# Patient Record
Sex: Male | Born: 1990 | Race: White | Hispanic: No | Marital: Single | Smoking: Current every day smoker
Health system: Southern US, Community
[De-identification: ages and names within clinical notes are randomized; demographics above are authoritative.]

---
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
## 2012-07-14 ENCOUNTER — Emergency Department (HOSPITAL_COMMUNITY): Payer: Self-pay

## 2012-07-14 ENCOUNTER — Emergency Department (HOSPITAL_COMMUNITY)
Admission: EM | Admit: 2012-07-14 | Discharge: 2012-07-14 | Disposition: A | Discharge status: Home / Self Care | Payer: Self-pay | Attending: Emergency Medicine | Admitting: Emergency Medicine

## 2012-07-14 ENCOUNTER — Encounter (HOSPITAL_COMMUNITY): Payer: Self-pay | Admitting: Family Medicine

## 2012-07-14 DIAGNOSIS — F17200 Nicotine dependence, unspecified, uncomplicated: Secondary | ICD-10-CM | POA: Insufficient documentation

## 2012-07-14 DIAGNOSIS — Y92410 Unspecified street and highway as the place of occurrence of the external cause: Secondary | ICD-10-CM | POA: Insufficient documentation

## 2012-07-14 DIAGNOSIS — S39012A Strain of muscle, fascia and tendon of lower back, initial encounter: Secondary | ICD-10-CM

## 2012-07-14 DIAGNOSIS — S335XXA Sprain of ligaments of lumbar spine, initial encounter: Secondary | ICD-10-CM | POA: Insufficient documentation

## 2012-07-14 DIAGNOSIS — Y9389 Activity, other specified: Secondary | ICD-10-CM | POA: Insufficient documentation

## 2012-07-14 DIAGNOSIS — S161XXA Strain of muscle, fascia and tendon at neck level, initial encounter: Secondary | ICD-10-CM

## 2012-07-14 DIAGNOSIS — S139XXA Sprain of joints and ligaments of unspecified parts of neck, initial encounter: Secondary | ICD-10-CM | POA: Insufficient documentation

## 2012-07-14 MED ORDER — METAXALONE 800 MG PO TABS: 800.0000 mg | ORAL_TABLET | Freq: Three times a day (TID) | ORAL | Status: AC | PRN

## 2012-07-14 MED ORDER — NAPROXEN 500 MG PO TABS: 500.0000 mg | ORAL_TABLET | Freq: Two times a day (BID) | ORAL | Status: DC | PRN
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
## 2012-07-14 NOTE — ED Notes (Signed)
Pharmacist at [facility] called to see if we could change the Rx for Selaxin to something cheaper for pt.  Per Muthersbaugh PA, I called in a Rx for Robaxin 500 mg, 1 tab PO Q6 hrs PRN muscle spasm, #20 instead.  Called to [phone] [employer].
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
## 2014-04-28 ENCOUNTER — Emergency Department (HOSPITAL_BASED_OUTPATIENT_CLINIC_OR_DEPARTMENT_OTHER)
Admission: EM | Admit: 2014-04-28 | Discharge: 2014-04-28 | Disposition: A | Discharge status: Home / Self Care | Payer: No Typology Code available for payment source | Attending: Emergency Medicine | Admitting: Emergency Medicine

## 2014-04-28 ENCOUNTER — Encounter (HOSPITAL_BASED_OUTPATIENT_CLINIC_OR_DEPARTMENT_OTHER): Payer: Self-pay | Admitting: Emergency Medicine

## 2014-04-28 ENCOUNTER — Emergency Department (HOSPITAL_BASED_OUTPATIENT_CLINIC_OR_DEPARTMENT_OTHER): Payer: No Typology Code available for payment source

## 2014-04-28 DIAGNOSIS — Z79899 Other long term (current) drug therapy: Secondary | ICD-10-CM | POA: Insufficient documentation

## 2014-04-28 DIAGNOSIS — Y929 Unspecified place or not applicable: Secondary | ICD-10-CM | POA: Insufficient documentation

## 2014-04-28 DIAGNOSIS — S0993XA Unspecified injury of face, initial encounter: Secondary | ICD-10-CM | POA: Insufficient documentation

## 2014-04-28 DIAGNOSIS — IMO0002 Reserved for concepts with insufficient information to code with codable children: Secondary | ICD-10-CM | POA: Insufficient documentation

## 2014-04-28 DIAGNOSIS — Y9389 Activity, other specified: Secondary | ICD-10-CM | POA: Insufficient documentation

## 2014-04-28 DIAGNOSIS — Z791 Long term (current) use of non-steroidal anti-inflammatories (NSAID): Secondary | ICD-10-CM | POA: Insufficient documentation

## 2014-04-28 DIAGNOSIS — F17200 Nicotine dependence, unspecified, uncomplicated: Secondary | ICD-10-CM | POA: Diagnosis not present

## 2014-04-28 DIAGNOSIS — S199XXA Unspecified injury of neck, initial encounter: Secondary | ICD-10-CM | POA: Diagnosis present

## 2014-04-28 MED ORDER — CYCLOBENZAPRINE HCL 10 MG PO TABS: 10.0000 mg | ORAL_TABLET | Freq: Two times a day (BID) | ORAL | Status: AC | PRN

## 2014-04-28 MED ORDER — KETOROLAC TROMETHAMINE 60 MG/2ML IM SOLN
30.0000 mg | Freq: Once | INTRAMUSCULAR | Status: AC
  Administered 2014-04-28: 30 mg via INTRAMUSCULAR
  Filled 2014-04-28: qty 2
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
## 2014-04-30 ENCOUNTER — Emergency Department (HOSPITAL_BASED_OUTPATIENT_CLINIC_OR_DEPARTMENT_OTHER)
Admission: EM | Admit: 2014-04-30 | Discharge: 2014-04-30 | Disposition: A | Discharge status: Home / Self Care | Payer: No Typology Code available for payment source | Attending: Emergency Medicine | Admitting: Emergency Medicine

## 2014-04-30 ENCOUNTER — Encounter (HOSPITAL_BASED_OUTPATIENT_CLINIC_OR_DEPARTMENT_OTHER): Payer: Self-pay | Admitting: Emergency Medicine

## 2014-04-30 ENCOUNTER — Emergency Department (HOSPITAL_BASED_OUTPATIENT_CLINIC_OR_DEPARTMENT_OTHER)
Admission: EM | Admit: 2014-04-30 | Discharge: 2014-04-30 | Disposition: A | Discharge status: Home / Self Care | Payer: No Typology Code available for payment source

## 2014-04-30 DIAGNOSIS — Z79899 Other long term (current) drug therapy: Secondary | ICD-10-CM | POA: Insufficient documentation

## 2014-04-30 DIAGNOSIS — M542 Cervicalgia: Secondary | ICD-10-CM | POA: Insufficient documentation

## 2014-04-30 DIAGNOSIS — G8911 Acute pain due to trauma: Secondary | ICD-10-CM | POA: Diagnosis not present

## 2014-04-30 DIAGNOSIS — Z791 Long term (current) use of non-steroidal anti-inflammatories (NSAID): Secondary | ICD-10-CM | POA: Insufficient documentation

## 2014-04-30 DIAGNOSIS — F17200 Nicotine dependence, unspecified, uncomplicated: Secondary | ICD-10-CM | POA: Insufficient documentation

## 2014-04-30 DIAGNOSIS — S161XXD Strain of muscle, fascia and tendon at neck level, subsequent encounter: Secondary | ICD-10-CM

## 2014-04-30 MED ORDER — IBUPROFEN 800 MG PO TABS: 800.0000 mg | ORAL_TABLET | Freq: Three times a day (TID) | ORAL | Status: AC

## 2014-04-30 MED ORDER — HYDROCODONE-ACETAMINOPHEN 5-325 MG PO TABS: 1.0000 | ORAL_TABLET | ORAL | Status: AC | PRN

## 2014-04-30 NOTE — Discharge Instructions (Signed)
Cryotherapy °Cryotherapy means treatment with cold. Ice or gel packs can be used to reduce both pain and swelling. Ice is the most helpful within the first 24 to 48 hours after an injury or flare-up from overusing a muscle or joint. Sprains, strains, spasms, burning pain, shooting pain, and aches can all be eased with ice. Ice can also be used when recovering from surgery. Ice is effective, has very few side effects, and is safe for most people to use. °PRECAUTIONS  °Ice is not a safe treatment option for people with: °· Raynaud phenomenon. This is a condition affecting small blood vessels in the extremities. Exposure to cold may cause your problems to return. °· Cold hypersensitivity. There are many forms of cold hypersensitivity, including: °¨ Cold urticaria. Red, itchy hives appear on the skin when the tissues begin to warm after being iced. °¨ Cold erythema. This is a red, itchy rash caused by exposure to cold. °¨ Cold hemoglobinuria. Red blood cells break down when the tissues begin to warm after being iced. The hemoglobin that carry oxygen are passed into the urine because they cannot combine with blood proteins fast enough. °· Numbness or altered sensitivity in the area being iced. °If you have any of the following conditions, do not use ice until you have discussed cryotherapy with your caregiver: °· Heart conditions, such as arrhythmia, angina, or chronic heart disease. °· High blood pressure. °· Healing wounds or open skin in the area being iced. °· Current infections. °· Rheumatoid arthritis. °· Poor circulation. °· Diabetes. °Ice slows the blood flow in the region it is applied. This is beneficial when trying to stop inflamed tissues from spreading irritating chemicals to surrounding tissues. However, if you expose your skin to cold temperatures for too long or without the proper protection, you can damage your skin or nerves. Watch for signs of skin damage due to cold. °HOME CARE INSTRUCTIONS °Follow  these tips to use ice and cold packs safely. °· Place a dry or damp towel between the ice and skin. A damp towel will cool the skin more quickly, so you may need to shorten the time that the ice is used. °· For a more rapid response, add gentle compression to the ice. °· Ice for no more than 10 to 20 minutes at a time. The bonier the area you are icing, the less time it will take to get the benefits of ice. °· Check your skin after 5 minutes to make sure there are no signs of a poor response to cold or skin damage. °· Rest 20 minutes or more between uses. °· Once your skin is numb, you can end your treatment. You can test numbness by very lightly touching your skin. The touch should be so light that you do not see the skin dimple from the pressure of your fingertip. When using ice, most people will feel these normal sensations in this order: cold, burning, aching, and numbness. °· Do not use ice on someone who cannot communicate their responses to pain, such as small children or people with dementia. °HOW TO MAKE AN ICE PACK °Ice packs are the most common way to use ice therapy. Other methods include ice massage, ice baths, and cryosprays. Muscle creams that cause a cold, tingly feeling do not offer the same benefits that ice offers and should not be used as a substitute unless recommended by your caregiver. °To make an ice pack, do one of the following: °· Place crushed ice or a   bag of frozen vegetables in a sealable plastic bag. Squeeze out the excess air. Place this bag inside another plastic bag. Slide the bag into a pillowcase or place a damp towel between your skin and the bag.  Mix 3 parts water with 1 part rubbing alcohol. Freeze the mixture in a sealable plastic bag. When you remove the mixture from the freezer, it will be slushy. Squeeze out the excess air. Place this bag inside another plastic bag. Slide the bag into a pillowcase or place a damp towel between your skin and the bag. SEEK MEDICAL CARE  IF:  You develop white spots on your skin. This may give the skin a blotchy (mottled) appearance.  Your skin turns blue or pale.  Your skin becomes waxy or hard.  Your swelling gets worse. MAKE SURE YOU:   Understand these instructions.  Will watch your condition.  Will get help right away if you are not doing well or get worse. Document Released: 04/21/2011 Document Revised: 01/09/2014 Document Reviewed: 04/21/2011 [facility] Patient Information 2015 [place], [place]. This information is not intended to replace advice given to you by your health care provider. Make sure you discuss any questions you have with your health care provider. Cervical Sprain A cervical sprain is when the tissues (ligaments) that hold the neck bones in place stretch or tear. HOME CARE   Put ice on the injured area.  Put ice in a plastic bag.  Place a towel between your skin and the bag.  Leave the ice on for 15-20 minutes, 3-4 times a day.  You may have been given a collar to wear. This collar keeps your neck from moving while you heal.  Do not take the collar off unless told by your doctor.  If you have long hair, keep it outside of the collar.  Ask your doctor before changing the position of your collar. You may need to change its position over time to make it more comfortable.  If you are allowed to take off the collar for cleaning or bathing, follow your doctor's instructions on how to do it safely.  Keep your collar clean by wiping it with mild soap and water. Dry it completely. If the collar has removable pads, remove them every 1-2 days to hand wash them with soap and water. Allow them to air dry. They should be dry before you wear them in the collar.  Do not drive while wearing the collar.  Only take medicine as told by your doctor.  Keep all doctor visits as told.  Keep all physical therapy visits as told.  Adjust your work station so that you have good posture while you  work.  Avoid positions and activities that make your problems worse.  Warm up and stretch before being active. GET HELP IF:  Your pain is not controlled with medicine.  You cannot take less pain medicine over time as planned.  Your activity level does not improve as expected. GET HELP RIGHT AWAY IF:   You are bleeding.  Your stomach is upset.  You have an allergic reaction to your medicine.  You develop new problems that you cannot explain.  You lose feeling (become numb) or you cannot move any part of your body (paralysis).  You have tingling or weakness in any part of your body.  Your symptoms get worse. Symptoms include:  Pain, soreness, stiffness, puffiness (swelling), or a burning feeling in your neck.  Pain when your neck is touched.  Shoulder or  upper back pain.  Limited ability to move your neck.  Headache.  Dizziness.  Your hands or arms feel week, lose feeling, or tingle.  Muscle spasms.  Difficulty swallowing or chewing. MAKE SURE YOU:   Understand these instructions.  Will watch your condition.  Will get help right away if you are not doing well or get worse. Document Released: 02/11/2008 Document Revised: 04/27/2013 Document Reviewed: 03/02/2013 [facility] Patient Information 2015 [place], [place]. This information is not intended to replace advice given to you by your health care provider. Make sure you discuss any questions you have with your health care provider.

## 2014-04-30 NOTE — ED Notes (Signed)
Pt called x 3 no answer 

## 2014-04-30 NOTE — ED Notes (Signed)
Pt called x 1 no answer

## 2014-04-30 NOTE — ED Notes (Signed)
Pt was in [facility] Friday and was seen here.  Pt was discharged with flexeril but the medication makes him feel sick.  Pt is here for re-evaluation due to continued soreness in neck and back.  Pt is alert and oriented, ambulatory in triage

## 2014-04-30 NOTE — ED Notes (Signed)
Pt discharged to home with family. NAD.  
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
## 2014-05-15 NOTE — ED Provider Notes (Signed)
Medical screening examination/treatment/procedure(s) were performed by non-physician practitioner and as supervising physician I was immediately available for consultation/collaboration.   EKG Interpretation None        Rolland Porter, MD 05/15/14 [phone]

## 2015-07-12 ENCOUNTER — Emergency Department (HOSPITAL_COMMUNITY): Payer: Self-pay

## 2015-07-12 ENCOUNTER — Encounter (HOSPITAL_COMMUNITY): Payer: Self-pay | Admitting: *Deleted

## 2015-07-12 ENCOUNTER — Emergency Department (HOSPITAL_COMMUNITY)
Admission: EM | Admit: 2015-07-12 | Discharge: 2015-07-12 | Disposition: A | Discharge status: Home / Self Care | Payer: Self-pay | Attending: Emergency Medicine | Admitting: Emergency Medicine

## 2015-07-12 DIAGNOSIS — S22009A Unspecified fracture of unspecified thoracic vertebra, initial encounter for closed fracture: Secondary | ICD-10-CM

## 2015-07-12 DIAGNOSIS — S199XXA Unspecified injury of neck, initial encounter: Secondary | ICD-10-CM | POA: Insufficient documentation

## 2015-07-12 DIAGNOSIS — S70211A Abrasion, right hip, initial encounter: Secondary | ICD-10-CM | POA: Insufficient documentation

## 2015-07-12 DIAGNOSIS — S3992XA Unspecified injury of lower back, initial encounter: Secondary | ICD-10-CM | POA: Insufficient documentation

## 2015-07-12 DIAGNOSIS — Z79899 Other long term (current) drug therapy: Secondary | ICD-10-CM | POA: Insufficient documentation

## 2015-07-12 DIAGNOSIS — Y9389 Activity, other specified: Secondary | ICD-10-CM | POA: Insufficient documentation

## 2015-07-12 DIAGNOSIS — Z720 Tobacco use: Secondary | ICD-10-CM | POA: Insufficient documentation

## 2015-07-12 DIAGNOSIS — Y92410 Unspecified street and highway as the place of occurrence of the external cause: Secondary | ICD-10-CM | POA: Insufficient documentation

## 2015-07-12 DIAGNOSIS — Y998 Other external cause status: Secondary | ICD-10-CM | POA: Insufficient documentation

## 2015-07-12 DIAGNOSIS — S2220XA Unspecified fracture of sternum, initial encounter for closed fracture: Secondary | ICD-10-CM | POA: Insufficient documentation

## 2015-07-12 DIAGNOSIS — S301XXA Contusion of abdominal wall, initial encounter: Secondary | ICD-10-CM | POA: Insufficient documentation

## 2015-07-12 DIAGNOSIS — S30811A Abrasion of abdominal wall, initial encounter: Secondary | ICD-10-CM | POA: Insufficient documentation

## 2015-07-12 LAB — COMPREHENSIVE METABOLIC PANEL
ALBUMIN: 4.3 g/dL (ref 3.5–5.0)
ALK PHOS: 59 U/L (ref 38–126)
ALT: 33 U/L (ref 17–63)
AST: 29 U/L (ref 15–41)
Anion gap: 9 (ref 5–15)
BILIRUBIN TOTAL: 0.4 mg/dL (ref 0.3–1.2)
BUN: 9 mg/dL (ref 6–20)
CALCIUM: 9 mg/dL (ref 8.9–10.3)
CO2: 29 mmol/L (ref 22–32)
CREATININE: 0.81 mg/dL (ref 0.61–1.24)
Chloride: 104 mmol/L (ref 101–111)
GFR calc Af Amer: >60 mL/min (ref >60)
GFR calc non Af Amer: >60 mL/min (ref >60)
GLUCOSE: 102 mg/dL — AB (ref 65–99)
Potassium: 4.4 mmol/L (ref 3.5–5.1)
Sodium: 142 mmol/L (ref 135–145)
TOTAL PROTEIN: 6.6 g/dL (ref 6.5–8.1)

## 2015-07-12 LAB — CBC WITH DIFFERENTIAL/PLATELET
Basophils Absolute: 0.1 10*3/uL (ref 0.0–0.1)
Basophils Relative: 1 %
EOS PCT: 1 %
Eosinophils Absolute: 0.1 10*3/uL (ref 0.0–0.7)
HEMATOCRIT: 43.9 % (ref 39.0–52.0)
HEMOGLOBIN: 14.5 g/dL (ref 13.0–17.0)
LYMPHS ABS: 1.9 10*3/uL (ref 0.7–4.0)
Lymphocytes Relative: 24 %
MCH: 31.7 pg (ref 26.0–34.0)
MCHC: 33 g/dL (ref 30.0–36.0)
MCV: 95.9 fL (ref 78.0–100.0)
MONOS PCT: 9 %
Monocytes Absolute: 0.7 10*3/uL (ref 0.1–1.0)
Neutro Abs: 5.3 10*3/uL (ref 1.7–7.7)
Neutrophils Relative %: 65 %
Platelets: 188 10*3/uL (ref 150–400)
RBC: 4.58 MIL/uL (ref 4.22–5.81)
RDW: 14.7 % (ref 11.5–15.5)
SMEAR REVIEW: ADEQUATE
WBC: 8.1 10*3/uL (ref 4.0–10.5)

## 2015-07-12 LAB — ETHANOL: Alcohol, Ethyl (B): 211 mg/dL — ABNORMAL HIGH (ref <5)

## 2015-07-12 MED ORDER — OXYCODONE-ACETAMINOPHEN 5-325 MG PO TABS: 1.0000 | ORAL_TABLET | ORAL | Status: AC | PRN

## 2015-07-12 MED ORDER — OXYCODONE-ACETAMINOPHEN 5-325 MG PO TABS: 1.0000 | ORAL_TABLET | Freq: Once | ORAL | Status: DC

## 2015-07-12 MED ORDER — SODIUM CHLORIDE 0.9 % IV BOLUS (SEPSIS)
1000.0000 mL | Freq: Once | INTRAVENOUS | Status: AC
  Administered 2015-07-12: 1000 mL via INTRAVENOUS

## 2015-07-12 MED ORDER — NAPROXEN 500 MG PO TABS: 500.0000 mg | ORAL_TABLET | Freq: Two times a day (BID) | ORAL | Status: AC

## 2015-07-12 MED ORDER — MORPHINE SULFATE (PF) 4 MG/ML IV SOLN
4.0000 mg | Freq: Once | INTRAVENOUS | Status: AC
  Administered 2015-07-12: 4 mg via INTRAVENOUS
  Filled 2015-07-12: qty 1

## 2015-07-12 MED ORDER — ONDANSETRON HCL 4 MG/2ML IJ SOLN
4.0000 mg | Freq: Once | INTRAMUSCULAR | Status: AC
  Administered 2015-07-12: 4 mg via INTRAVENOUS
  Filled 2015-07-12: qty 2

## 2015-07-12 MED ORDER — IOHEXOL 300 MG/ML  SOLN
100.0000 mL | Freq: Once | INTRAMUSCULAR | Status: AC | PRN
  Administered 2015-07-12: 100 mL via INTRAVENOUS

## 2015-07-12 NOTE — ED Notes (Signed)
[facility] bedside.

## 2015-07-12 NOTE — ED Notes (Signed)
Family at bedside. 

## 2015-07-12 NOTE — ED Notes (Signed)
Patient transported to CT 

## 2015-07-12 NOTE — ED Notes (Signed)
[employer] at the bedside.
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
## 2015-07-12 NOTE — ED Notes (Signed)
Pt ate a [place] sandwich tolerated well , pt able to ambulate to bathroom without assistance , also tolerated well
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
## 2019-11-06 ENCOUNTER — Other Ambulatory Visit: Payer: Self-pay

## 2019-11-06 ENCOUNTER — Emergency Department (HOSPITAL_COMMUNITY)
Admission: EM | Admit: 2019-11-06 | Discharge: 2019-11-06 | Disposition: A | Discharge status: Home / Self Care | Payer: Self-pay | Attending: Emergency Medicine | Admitting: Emergency Medicine

## 2019-11-06 ENCOUNTER — Encounter (HOSPITAL_COMMUNITY): Payer: Self-pay | Admitting: Emergency Medicine

## 2019-11-06 DIAGNOSIS — F1210 Cannabis abuse, uncomplicated: Secondary | ICD-10-CM | POA: Insufficient documentation

## 2019-11-06 DIAGNOSIS — F10930 Alcohol use, unspecified with withdrawal, uncomplicated: Secondary | ICD-10-CM

## 2019-11-06 DIAGNOSIS — F17200 Nicotine dependence, unspecified, uncomplicated: Secondary | ICD-10-CM | POA: Insufficient documentation

## 2019-11-06 DIAGNOSIS — R1013 Epigastric pain: Secondary | ICD-10-CM | POA: Insufficient documentation

## 2019-11-06 DIAGNOSIS — F10230 Alcohol dependence with withdrawal, uncomplicated: Secondary | ICD-10-CM | POA: Insufficient documentation

## 2019-11-06 DIAGNOSIS — R1110 Vomiting, unspecified: Secondary | ICD-10-CM | POA: Insufficient documentation

## 2019-11-06 DIAGNOSIS — R251 Tremor, unspecified: Secondary | ICD-10-CM | POA: Insufficient documentation

## 2019-11-06 LAB — CBC WITH DIFFERENTIAL/PLATELET
Abs Immature Granulocytes: 0.06 10*3/uL (ref 0.00–0.07)
Basophils Absolute: 0 10*3/uL (ref 0.0–0.1)
Basophils Relative: 0 %
Eosinophils Absolute: 0.1 10*3/uL (ref 0.0–0.5)
Eosinophils Relative: 0 %
HCT: 47.9 % (ref 39.0–52.0)
Hemoglobin: 15.9 g/dL (ref 13.0–17.0)
Immature Granulocytes: 1 %
Lymphocytes Relative: 20 %
Lymphs Abs: 2.5 10*3/uL (ref 0.7–4.0)
MCH: 32.9 pg (ref 26.0–34.0)
MCHC: 33.2 g/dL (ref 30.0–36.0)
MCV: 99.2 fL (ref 80.0–100.0)
Monocytes Absolute: 1.2 10*3/uL — ABNORMAL HIGH (ref 0.1–1.0)
Monocytes Relative: 9 %
Neutro Abs: 9.1 10*3/uL — ABNORMAL HIGH (ref 1.7–7.7)
Neutrophils Relative %: 70 %
Platelets: 273 10*3/uL (ref 150–400)
RBC: 4.83 MIL/uL (ref 4.22–5.81)
RDW: 13 % (ref 11.5–15.5)
WBC: 13 10*3/uL — ABNORMAL HIGH (ref 4.0–10.5)
nRBC: 0 % (ref 0.0–0.2)

## 2019-11-06 LAB — URINALYSIS, ROUTINE W REFLEX MICROSCOPIC
Bilirubin Urine: NEGATIVE
Glucose, UA: NEGATIVE mg/dL
Hgb urine dipstick: NEGATIVE
Ketones, ur: 5 mg/dL — AB
Leukocytes,Ua: NEGATIVE
Nitrite: NEGATIVE
Protein, ur: 30 mg/dL — AB
Specific Gravity, Urine: 1.032 — ABNORMAL HIGH (ref 1.005–1.030)
pH: 5 (ref 5.0–8.0)

## 2019-11-06 LAB — RAPID URINE DRUG SCREEN, HOSP PERFORMED
Amphetamines: NOT DETECTED
Barbiturates: NOT DETECTED
Benzodiazepines: NOT DETECTED
Cocaine: NOT DETECTED
Opiates: NOT DETECTED
Tetrahydrocannabinol: POSITIVE — AB

## 2019-11-06 LAB — LIPASE, BLOOD: Lipase: 91 U/L — ABNORMAL HIGH (ref 11–51)

## 2019-11-06 LAB — COMPREHENSIVE METABOLIC PANEL
ALT: 46 U/L — ABNORMAL HIGH (ref 0–44)
AST: 44 U/L — ABNORMAL HIGH (ref 15–41)
Albumin: 4.4 g/dL (ref 3.5–5.0)
Alkaline Phosphatase: 45 U/L (ref 38–126)
Anion gap: 17 — ABNORMAL HIGH (ref 5–15)
BUN: 17 mg/dL (ref 6–20)
CO2: 18 mmol/L — ABNORMAL LOW (ref 22–32)
Calcium: 9.4 mg/dL (ref 8.9–10.3)
Chloride: 101 mmol/L (ref 98–111)
Creatinine, Ser: 0.91 mg/dL (ref 0.61–1.24)
GFR calc Af Amer: >60 mL/min (ref >60)
GFR calc non Af Amer: >60 mL/min (ref >60)
Glucose, Bld: 136 mg/dL — ABNORMAL HIGH (ref 70–99)
Potassium: 3.7 mmol/L (ref 3.5–5.1)
Sodium: 136 mmol/L (ref 135–145)
Total Bilirubin: 0.8 mg/dL (ref 0.3–1.2)
Total Protein: 7.1 g/dL (ref 6.5–8.1)

## 2019-11-06 LAB — CBG MONITORING, ED: Glucose-Capillary: 120 mg/dL — ABNORMAL HIGH (ref 70–99)

## 2019-11-06 LAB — MAGNESIUM: Magnesium: 1.8 mg/dL (ref 1.7–2.4)

## 2019-11-06 LAB — ETHANOL: Alcohol, Ethyl (B): 14 mg/dL — ABNORMAL HIGH (ref <10)

## 2019-11-06 MED ORDER — LACTATED RINGERS IV BOLUS
1000.0000 mL | Freq: Once | INTRAVENOUS | Status: AC
  Administered 2019-11-06: 18:00:00 1000 mL via INTRAVENOUS

## 2019-11-06 MED ORDER — THIAMINE HCL 100 MG PO TABS: 100.0000 mg | ORAL_TABLET | Freq: Every day | ORAL | Status: DC

## 2019-11-06 MED ORDER — LORAZEPAM 2 MG/ML IJ SOLN
0.0000 mg | Freq: Four times a day (QID) | INTRAMUSCULAR | Status: DC
  Administered 2019-11-06: 17:00:00 2 mg via INTRAVENOUS
  Filled 2019-11-06: qty 1

## 2019-11-06 MED ORDER — CHLORDIAZEPOXIDE HCL 25 MG PO CAPS: ORAL_CAPSULE | ORAL | 0 refills | Status: AC

## 2019-11-06 MED ORDER — THIAMINE HCL 100 MG/ML IJ SOLN
100.0000 mg | Freq: Every day | INTRAMUSCULAR | Status: DC
  Administered 2019-11-06: 17:00:00 100 mg via INTRAVENOUS
  Filled 2019-11-06: qty 2

## 2019-11-06 MED ORDER — LACTATED RINGERS IV BOLUS
1000.0000 mL | Freq: Once | INTRAVENOUS | Status: AC
Start: 2019-11-06 — End: 2019-11-06
  Administered 2019-11-06: 17:00:00 1000 mL via INTRAVENOUS

## 2019-11-06 MED ORDER — FAMOTIDINE IN NACL 20-0.9 MG/50ML-% IV SOLN
20.0000 mg | Freq: Once | INTRAVENOUS | Status: AC
  Administered 2019-11-06: 18:00:00 20 mg via INTRAVENOUS
  Filled 2019-11-06: qty 50

## 2019-11-06 NOTE — ED Provider Notes (Signed)
[facility] EMERGENCY DEPARTMENT Provider Note   CSN: [id number] Arrival date & time: 11/06/19  1536     History Chief Complaint  Patient presents with  . Withdrawal    William Solomon is a 29 y.o. 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  He reports that he had resumed alcohol use and that about 1.5 months ago it got bad when he was diagnosed with covid and was quarentined.  He reports that last night he and his girlfriend argued and that was what made him realize he needs to stop drinking.  He reports that he drinks about 1/5 of vodka every night.  He states that last night he had multiple bouts of vomiting and that after vomiting many times got slight epigastric abdominal pain.  No diarrhea or fevers. He reports his last drink was about0100 on 11/06/19.  He states that when he woke up at 6 AM he noted that he had shakes.  He denies any recent trauma.  He states that in addition to alcohol he uses marijuana, however denies any recent injection drugs in the past 4 years for opioid abuse in the past 4 years.  Of note patient reports he gets light headed any time he gets an IV or IM injection.    HPI     History reviewed. No pertinent past medical history.  There are no problems to display for this patient.   History reviewed. No pertinent surgical history.     History reviewed. No pertinent family history.  Social History   Tobacco Use  . Smoking status: Current Every Day Smoker  Substance Use Topics  . Alcohol use: No  . Drug use: No    Home Medications Prior to Admission medications   Medication Sig Start Date End Date Taking? Authorizing Provider  ibuprofen (ADVIL) 200 MG tablet Take 400 mg by mouth every 6 (six) hours as needed for headache or mild pain.   Yes [provider]  chlordiazePOXIDE (LIBRIUM) 25 MG capsule 50mg  PO TID x 1D, then 25-50mg  PO  BID X 1D, then 25-50mg  PO QD X 1D 11/06/19   11/08/19, PA-C  cyclobenzaprine (FLEXERIL) 10 MG tablet Take 1 tablet (10 mg total) by mouth 2 (two) times daily as needed for muscle spasms. Patient not taking: Reported on 11/06/2019 04/28/14   04/30/14, MD  HYDROcodone-acetaminophen (NORCO/VICODIN) 5-325 MG per tablet Take 1-2 tablets by mouth every 4 (four) hours as needed. Patient not taking: Reported on 11/06/2019 04/30/14   05/02/14, PA-C  ibuprofen (ADVIL,MOTRIN) 800 MG tablet Take 1 tablet (800 mg total) by mouth 3 (three) times daily. Patient not taking: Reported on 11/06/2019 04/30/14   05/02/14, PA-C  metaxalone (SKELAXIN) 800 MG tablet Take 1 tablet (800 mg total) by mouth 3 (three) times daily as needed for pain (Take 1 table every 8 hours as needed for pain.). Patient not taking: Reported on 11/06/2019 07/14/12   13/6/13, MD  naproxen (NAPROSYN) 500 MG tablet Take 1 tablet (500 mg total) by mouth 2 (two) times daily. Patient not taking: Reported on 11/06/2019 07/12/15   Horton, 13/3/16, MD  oxyCODONE-acetaminophen (PERCOCET/ROXICET) 5-325 MG tablet Take 1-2 tablets by mouth every 4 (four) hours as needed for severe pain. Patient not taking: Reported on 11/06/2019 07/12/15   Horton, 13/3/16, MD    Allergies    Patient has no known  allergies.  Review of Systems   Review of Systems  Constitutional: Negative for chills and fever.  Respiratory: Negative for cough and shortness of breath.   Cardiovascular: Negative for chest pain.  Gastrointestinal: Positive for abdominal pain, nausea and vomiting. Negative for constipation and diarrhea.  Genitourinary: Negative for dysuria.  Musculoskeletal: Negative for back pain.  Neurological: Positive for tremors. Negative for speech difficulty, weakness and headaches.  Psychiatric/Behavioral: Negative for hallucinations and suicidal ideas. The patient is nervous/anxious.   All other systems reviewed and are  negative.   Physical Exam Updated Vital Signs BP (!) 150/107   Pulse (!) 104   Temp 99 F (37.2 C) (Oral)   Resp 13   Wt 90.7 kg   SpO2 97%   BMI 25.00 kg/m   Physical Exam Vitals and nursing note reviewed.  Constitutional:      General: He is not in acute distress.    Appearance: He is well-developed. He is not diaphoretic.  HENT:     Head: Normocephalic and atraumatic.  Eyes:     General: No scleral icterus.       Right eye: No discharge.        Left eye: No discharge.     Conjunctiva/sclera: Conjunctivae normal.  Cardiovascular:     Rate and Rhythm: Regular rhythm. Tachycardia present.     Pulses: Normal pulses.     Heart sounds: Normal heart sounds.  Pulmonary:     Effort: Pulmonary effort is normal. No respiratory distress.     Breath sounds: Normal breath sounds. No stridor.  Abdominal:     General: Abdomen is flat. There is no distension.     Tenderness: There is abdominal tenderness (Mild, epigastric). There is no guarding or rebound.  Musculoskeletal:        General: No deformity.     Cervical back: Normal range of motion and neck supple. No rigidity.     Right lower leg: No edema.     Left lower leg: No edema.  Skin:    General: Skin is warm and dry.  Neurological:     Mental Status: He is alert.     Motor: No abnormal muscle tone.     Comments: Awake and alert, answers questions appropriately.  Speech is not slurred.  Spontaneous movements symmetrically of bilateral arms and legs.  Facial movements are symmetric.  Diffuse tremor.  Psychiatric:        Attention and Perception: Attention normal.        Mood and Affect: Mood is anxious.        Speech: Speech normal.        Behavior: Behavior is cooperative.        Thought Content: Thought content does not include homicidal or suicidal ideation. Thought content does not include homicidal or suicidal plan.     Comments: Does not appear to be responding to internal stimuli.      ED Results / Procedures /  Treatments   Labs (all labs ordered are listed, but only abnormal results are displayed) Labs Reviewed  COMPREHENSIVE METABOLIC PANEL - Abnormal; Notable for the following components:      Result Value   CO2 18 (*)    Glucose, Bld 136 (*)    AST 44 (*)    ALT 46 (*)    Anion gap 17 (*)    All other components within normal limits  CBC WITH DIFFERENTIAL/PLATELET - Abnormal; Notable for the following components:   WBC 13.0 (*)  Neutro Abs 9.1 (*)    Monocytes Absolute 1.2 (*)    All other components within normal limits  ETHANOL - Abnormal; Notable for the following components:   Alcohol, Ethyl (B) 14 (*)    All other components within normal limits  LIPASE, BLOOD - Abnormal; Notable for the following components:   Lipase 91 (*)    All other components within normal limits  URINALYSIS, ROUTINE W REFLEX MICROSCOPIC - Abnormal; Notable for the following components:   APPearance HAZY (*)    Specific Gravity, Urine 1.032 (*)    Ketones, ur 5 (*)    Protein, ur 30 (*)    Bacteria, UA RARE (*)    All other components within normal limits  RAPID URINE DRUG SCREEN, HOSP PERFORMED - Abnormal; Notable for the following components:   Tetrahydrocannabinol POSITIVE (*)    All other components within normal limits  CBG MONITORING, ED - Abnormal; Notable for the following components:   Glucose-Capillary 120 (*)    All other components within normal limits  MAGNESIUM    EKG EKG Interpretation  Date/Time:  Sunday November 06 2019 15:40:29 EST Ventricular Rate:  149 PR Interval:  116 QRS Duration: 82 QT Interval:  276 QTC Calculation: 434 R Axis:   96 Text Interpretation: Sinus tachycardia Possible Lateral infarct , age undetermined Cannot rule out Inferior infarct , age undetermined Abnormal ECG Confirmed by Carmin Muskrat [phone]) on 11/06/2019 3:49:09 PM   Radiology No results found.  Procedures Procedures (including critical care time)  Medications Ordered in  ED Medications  LORazepam (ATIVAN) injection 0-4 mg (2 mg Intravenous Given 11/06/19 1636)  thiamine tablet 100 mg ( Oral See Alternative 11/06/19 1636)    Or  thiamine (B-1) injection 100 mg (100 mg Intravenous Given 11/06/19 1636)  lactated ringers bolus 1,000 mL (0 mLs Intravenous Stopped 11/06/19 2003)  famotidine (PEPCID) IVPB 20 mg premix (0 mg Intravenous Stopped 11/06/19 1846)  lactated ringers bolus 1,000 mL (0 mLs Intravenous Stopped 11/06/19 1846)    ED Course  I have reviewed the triage vital signs and the nursing notes.  Pertinent labs & imaging results that were available during my care of the patient were reviewed by me and considered in my medical decision making (see chart for details).      After patient had IV placed by RN he had a presyncopal episode during which second 12 lead EKG was obtained. .  I evaluated him during this and he was pale, cool, diaphoretic however he did not pass out.  This resolved with time and being placed in Trendelenburg without other complicating events.   MDM Rules/Calculators/A&P                      William Solomon is a 29 year old who presents today for alcohol withdrawal.  At baseline he reports he drinks about 1/5 of vodka plan worsening over the past month however is ready to stop.  When he woke up this morning at 6 AM he noted tremors. He has vomited multiple times prior to arrival.  On arrival he had significant tachycardia with a heart rate of 155.  This improved with IV fluids and Ativan to treat withdrawal symptoms.  He has not had any withdrawal symptoms and is not responding to internal stimuli. Labs are obtained and reviewed, white count is slightly elevated which I suspect is reactive to his vomiting.  CMP shows minimal transaminitis with AST of 44 and ALT of 46.  He  does have a slight anion gap but 17.  His lipase is elevated at 91.  He does have mild epigastric pain on palpation and I suspect he has an element of pancreatitis.  UDS  is positive for THC. His ethanol is minimally elevated at 14.   Patient had previously had covid and recovered.   UA shows dehydration.   He was treated with 2 liters IVF,  IV pepcid, and IV thiamine and ativan after which he felt much better.    Repeat abdominal exam is reassuring and he does not appear to have an acute surgical abdomen. We discussed possible pancreatitis, along with role of clear liquid diet over the next 2 to 3 days to treat his symptoms.  Given the mild nature of it, along with him tolerating p.o. challenge without difficulty at this time I suspect that CT scan would not have a high yield.    We discussed the importance of not drinking while taking Librium. [facility] [place] PMP is consulted and he is given a prescription for Librium taper. Peers support consult was placed. He is also given resources on both outpatient and inpatient alcohol treatment. We discussed the importance of not driving or performing other dangerous tasks while taking Librium.  Return precautions were discussed with patient who states their understanding.  At the time of discharge patient denied any unaddressed complaints or concerns.  Patient is agreeable for discharge home.  Note: Portions of this report may have been transcribed using voice recognition software. Every effort was made to ensure accuracy; however, inadvertent computerized transcription errors may be present  Final Clinical Impression(s) / ED Diagnoses Final diagnoses:  Alcohol withdrawal syndrome without complication [facility])    Rx / DC Orders ED Discharge Orders         Ordered    chlordiazePOXIDE (LIBRIUM) 25 MG capsule     11/06/19 1955           Norman Clay 11/06/19 2306    Gerhard Munch, MD 11/06/19 2321

## 2019-11-06 NOTE — ED Notes (Signed)
Patient verbalizes understanding of discharge instructions. Opportunity for questioning and answers were provided. Armband removed by staff, pt discharged from ED. Pt. ambulatory and discharged home.  

## 2019-11-06 NOTE — Discharge Instructions (Signed)
Today you received medications that may make you sleepy or impair your ability to make decisions.  For the next 24 hours please do not drive, operate heavy machinery, care for a small child with out another adult present, or perform any activities that may cause harm to you or someone else if you were to fall asleep or be impaired.   You are being prescribed a medication which may make you sleepy. Please follow up of listed precautions for at least 24 hours after taking one dose.  

## 2019-11-06 NOTE — ED Triage Notes (Signed)
Pt arrives to ED from home with complaints of alcohol withdraw. Patient states that his last drink was last night, <24hrs. Patient drinks about a fifth of vodka per night for the last month. Patient states he is ready to stop drinking.
